# Patient Record
Sex: Male | Born: 1985 | Hispanic: No | Marital: Single | Smoking: Never smoker
Health system: Southern US, Community
[De-identification: ages and names within clinical notes are randomized; demographics above are authoritative.]

---
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
## 2014-01-25 ENCOUNTER — Encounter (HOSPITAL_COMMUNITY): Payer: Self-pay | Admitting: Emergency Medicine

## 2014-01-25 ENCOUNTER — Emergency Department (INDEPENDENT_AMBULATORY_CARE_PROVIDER_SITE_OTHER): Payer: Self-pay

## 2014-01-25 ENCOUNTER — Emergency Department (INDEPENDENT_AMBULATORY_CARE_PROVIDER_SITE_OTHER)
Admission: EM | Admit: 2014-01-25 | Discharge: 2014-01-25 | Disposition: A | Discharge status: Nursing Home (Includes ICF) | Payer: Self-pay | Source: Home / Self Care

## 2014-01-25 ENCOUNTER — Emergency Department (HOSPITAL_COMMUNITY)
Admission: EM | Admit: 2014-01-25 | Discharge: 2014-01-25 | Discharge status: Against Medical Advice | Payer: Self-pay | Attending: Emergency Medicine | Admitting: Emergency Medicine

## 2014-01-25 DIAGNOSIS — R0789 Other chest pain: Secondary | ICD-10-CM

## 2014-01-25 DIAGNOSIS — R319 Hematuria, unspecified: Secondary | ICD-10-CM | POA: Insufficient documentation

## 2014-01-25 DIAGNOSIS — S3981XA Other specified injuries of abdomen, initial encounter: Secondary | ICD-10-CM | POA: Insufficient documentation

## 2014-01-25 DIAGNOSIS — W1809XA Striking against other object with subsequent fall, initial encounter: Secondary | ICD-10-CM

## 2014-01-25 DIAGNOSIS — Y929 Unspecified place or not applicable: Secondary | ICD-10-CM | POA: Insufficient documentation

## 2014-01-25 DIAGNOSIS — W19XXXA Unspecified fall, initial encounter: Secondary | ICD-10-CM

## 2014-01-25 DIAGNOSIS — IMO0002 Reserved for concepts with insufficient information to code with codable children: Secondary | ICD-10-CM

## 2014-01-25 DIAGNOSIS — R3129 Other microscopic hematuria: Secondary | ICD-10-CM

## 2014-01-25 DIAGNOSIS — W1789XA Other fall from one level to another, initial encounter: Secondary | ICD-10-CM | POA: Insufficient documentation

## 2014-01-25 DIAGNOSIS — R1084 Generalized abdominal pain: Secondary | ICD-10-CM

## 2014-01-25 DIAGNOSIS — R071 Chest pain on breathing: Secondary | ICD-10-CM

## 2014-01-25 DIAGNOSIS — Y9389 Activity, other specified: Secondary | ICD-10-CM | POA: Insufficient documentation

## 2014-01-25 DIAGNOSIS — T07XXXA Unspecified multiple injuries, initial encounter: Secondary | ICD-10-CM

## 2014-01-25 LAB — POCT URINALYSIS DIP (DEVICE)
Bilirubin Urine: NEGATIVE
Glucose, UA: NEGATIVE mg/dL
Ketones, ur: NEGATIVE mg/dL
Leukocytes, UA: NEGATIVE
Nitrite: NEGATIVE
Protein, ur: NEGATIVE mg/dL
Specific Gravity, Urine: >=1.03 (ref 1.005–1.030)
Urobilinogen, UA: 0.2 mg/dL (ref 0.0–1.0)
pH: 6 (ref 5.0–8.0)

## 2014-01-25 MED ORDER — TETANUS-DIPHTH-ACELL PERTUSSIS 5-2.5-18.5 LF-MCG/0.5 IM SUSP
0.5000 mL | Freq: Once | INTRAMUSCULAR | Status: AC
  Administered 2014-01-25: 0.5 mL via INTRAMUSCULAR

## 2014-01-25 MED ORDER — TETANUS-DIPHTH-ACELL PERTUSSIS 5-2.5-18.5 LF-MCG/0.5 IM SUSP
INTRAMUSCULAR | Status: AC
  Filled 2014-01-25: qty 0.5

## 2014-01-25 NOTE — ED Provider Notes (Signed)
Medical screening examination/treatment/procedure(s) were performed by a resident physician or non-physician practitioner and as the supervising physician I was immediately available for consultation/collaboration.  Shelly Flattenavid Marlen Koman, MD Family Medicine   Ozella Rocksavid J Connell Bognar, MD 01/25/14 [phone]

## 2014-01-25 NOTE — ED Notes (Signed)
Injury occurred 2 hrs ago.

## 2014-01-25 NOTE — ED Notes (Signed)
Pt sent here from [facility] due to having a fall approx 7-8 ft today and landing on abd, denies loc. Having abd pain and left flank pain, sent here for ct scan due blood in urine.

## 2014-01-25 NOTE — ED Notes (Signed)
Large area of linear abrasions across abdomen.  Denies abd pain (only abrasion pain on stomach; denies internally).  C/O left lateral and posterior rib pain.  Denies SOB - states he was initially, but is no longer.  Left lateral & poterior ribs tender to touch.  Denies spinal pain.  Via interpreter: unclear description, but describes going over a fence that was butted up against a concrete block wall, when he slid forward onto concrete?

## 2014-01-25 NOTE — ED Notes (Signed)
Upon further investigation, pt states his stomach slid across the edge of the concrete wall, and he flipped over, landing on back on ground.  Continues to deny any spinal pain or head injury.

## 2014-01-25 NOTE — ED Provider Notes (Signed)
CSN: [id number]     Arrival date & time 01/25/14  1256 History   First MD Initiated Contact with Patient 01/25/14 1335     Chief Complaint  Patient presents with  . Fall   (Consider location/radiation/quality/duration/timing/severity/associated sxs/prior Treatment) HPI Comments: Nursing notes read. Fell over fence as described. C/O pain over abdomen, L lateral and posterior chest wall and ribs.   Patient is a 28 y.o. male presenting with fall.  Fall Associated symptoms include chest pain and abdominal pain. Pertinent negatives include no headaches and no shortness of breath.    History reviewed. No pertinent past medical history. History reviewed. No pertinent past surgical history. No family history on file. History  Substance Use Topics  . Smoking status: Never Smoker   . Smokeless tobacco: Not on file  . Alcohol Use: Yes     Comment: drinks weekly    Review of Systems  Constitutional: Positive for activity change. Negative for fever and fatigue.  HENT: Negative.   Eyes: Negative.   Respiratory: Negative.  Negative for cough and shortness of breath.   Cardiovascular: Positive for chest pain. Negative for palpitations and leg swelling.  Gastrointestinal: Positive for abdominal pain. Negative for nausea and vomiting.  Genitourinary: Negative.   Musculoskeletal:       As per HPI No pain or tenderness to head,, neck, spine, R back or chest, upper or lower extremities.  Skin: Negative.        Abdominal abrasions  Neurological: Negative for dizziness, weakness, numbness and headaches.    Allergies  Review of patient's allergies indicates no known allergies.  Home Medications   Prior to Admission medications   Not on File   BP 137/68  Pulse 55  Temp(Src) 98.5 F (36.9 C) (Oral)  Resp 18  SpO2 99% Physical Exam  Nursing note and vitals reviewed. Constitutional: He is oriented to person, place, and time. He appears well-developed and well-nourished.  HENT:  Head:  Normocephalic and atraumatic.  Eyes: Conjunctivae and EOM are normal. Pupils are equal, round, and reactive to light. Left eye exhibits no discharge.  Neck: Normal range of motion. Neck supple.  Nontender, no deformity  Cardiovascular: Normal rate, regular rhythm and normal heart sounds.   Pulmonary/Chest: Effort normal and breath sounds normal. No respiratory distress. He has no wheezes. He has no rales.  Tenderness to the L lateral chest wall, anterior, lateral and lesser to the upper posterior chest.  Abdominal: He exhibits no distension and no mass. There is tenderness. There is guarding.  Tenderness across the lower abdomen and epigastrium with guarding. Tenderness mostly to L mid abd.  Musculoskeletal: Normal range of motion. He exhibits no edema and no tenderness.  Neurological: He is alert and oriented to person, place, and time. No cranial nerve deficit.  Skin: Skin is warm and dry.  Multiple abrasions across the abdomen.  Psychiatric: He has a normal mood and affect.    ED Course  Procedures (including critical care time) Labs Review Labs Reviewed  POCT URINALYSIS DIP (DEVICE) - Abnormal; Notable for the following:    Hgb urine dipstick MODERATE (*)    All other components within normal limits    Imaging Review Dg Ribs Unilateral W/chest Left  01/25/2014   CLINICAL DATA:  28 year old male with chest and left rib pain following injury  EXAM: LEFT RIBS AND CHEST - 3+ VIEW  COMPARISON:  None.  FINDINGS: The cardiomediastinal silhouette is unremarkable.  There is no evidence of focal airspace disease, pulmonary edema,  suspicious pulmonary nodule/mass, pleural effusion, or pneumothorax. No acute bony abnormalities are identified.  There is no evidence of left rib fracture or abnormality.  IMPRESSION: Negative.   Electronically Signed   By: Laveda AbbeJeff  Hu M.D.   On: 01/25/2014 14:19     MDM   1. Struck fence with fall   2. Generalized abdominal pain   3. Multiple abrasions   4.  Left-sided chest wall pain   5. Hematuria, microscopic    Consult with Dr. Artis FlockKindl. L rib detail and chest films with no seen pathology. [facility] for evaluation of abdominal tenderness, trauma and hematuria S/P fall.    Hayden Rasmussenavid Erricka Falkner, NP 01/25/14 1434  Hayden Rasmussenavid Decarlo Rivet, NP 01/25/14 1438

## 2014-01-29 ENCOUNTER — Encounter (HOSPITAL_COMMUNITY): Payer: Self-pay | Admitting: Emergency Medicine

## 2014-01-29 ENCOUNTER — Emergency Department (HOSPITAL_COMMUNITY): Payer: Self-pay

## 2014-01-29 ENCOUNTER — Emergency Department (HOSPITAL_COMMUNITY)
Admission: EM | Admit: 2014-01-29 | Discharge: 2014-01-29 | Disposition: A | Discharge status: Home / Self Care | Payer: Self-pay | Attending: Emergency Medicine | Admitting: Emergency Medicine

## 2014-01-29 DIAGNOSIS — R0602 Shortness of breath: Secondary | ICD-10-CM | POA: Insufficient documentation

## 2014-01-29 DIAGNOSIS — T07XXXA Unspecified multiple injuries, initial encounter: Secondary | ICD-10-CM

## 2014-01-29 DIAGNOSIS — Y929 Unspecified place or not applicable: Secondary | ICD-10-CM | POA: Insufficient documentation

## 2014-01-29 DIAGNOSIS — Y9389 Activity, other specified: Secondary | ICD-10-CM | POA: Insufficient documentation

## 2014-01-29 DIAGNOSIS — R079 Chest pain, unspecified: Secondary | ICD-10-CM | POA: Insufficient documentation

## 2014-01-29 DIAGNOSIS — W1809XA Striking against other object with subsequent fall, initial encounter: Secondary | ICD-10-CM | POA: Insufficient documentation

## 2014-01-29 DIAGNOSIS — IMO0002 Reserved for concepts with insufficient information to code with codable children: Secondary | ICD-10-CM | POA: Insufficient documentation

## 2014-01-29 DIAGNOSIS — S20212S Contusion of left front wall of thorax, sequela: Secondary | ICD-10-CM

## 2014-01-29 DIAGNOSIS — S20219A Contusion of unspecified front wall of thorax, initial encounter: Secondary | ICD-10-CM | POA: Insufficient documentation

## 2014-01-29 LAB — CBC WITH DIFFERENTIAL/PLATELET
BASOS ABS: 0 10*3/uL (ref 0.0–0.1)
BASOS PCT: 0 % (ref 0–1)
Eosinophils Absolute: 0.2 10*3/uL (ref 0.0–0.7)
Eosinophils Relative: 4 % (ref 0–5)
HCT: 44.9 % (ref 39.0–52.0)
Hemoglobin: 15.3 g/dL (ref 13.0–17.0)
LYMPHS PCT: 31 % (ref 12–46)
Lymphs Abs: 1.8 10*3/uL (ref 0.7–4.0)
MCH: 30.9 pg (ref 26.0–34.0)
MCHC: 34.1 g/dL (ref 30.0–36.0)
MCV: 90.7 fL (ref 78.0–100.0)
MONO ABS: 0.4 10*3/uL (ref 0.1–1.0)
Monocytes Relative: 6 % (ref 3–12)
Neutro Abs: 3.3 10*3/uL (ref 1.7–7.7)
Neutrophils Relative %: 59 % (ref 43–77)
PLATELETS: 165 10*3/uL (ref 150–400)
RBC: 4.95 MIL/uL (ref 4.22–5.81)
RDW: 12.3 % (ref 11.5–15.5)
WBC: 5.7 10*3/uL (ref 4.0–10.5)

## 2014-01-29 LAB — URINALYSIS, ROUTINE W REFLEX MICROSCOPIC
Bilirubin Urine: NEGATIVE
Glucose, UA: NEGATIVE mg/dL
KETONES UR: NEGATIVE mg/dL
LEUKOCYTES UA: NEGATIVE
Nitrite: NEGATIVE
PROTEIN: NEGATIVE mg/dL
Specific Gravity, Urine: 1.035 — ABNORMAL HIGH (ref 1.005–1.030)
UROBILINOGEN UA: 0.2 mg/dL (ref 0.0–1.0)
pH: 5.5 (ref 5.0–8.0)

## 2014-01-29 LAB — URINE MICROSCOPIC-ADD ON

## 2014-01-29 MED ORDER — OXYCODONE-ACETAMINOPHEN 5-325 MG PO TABS
1.0000 | ORAL_TABLET | Freq: Once | ORAL | Status: AC
  Administered 2014-01-29: 1 via ORAL
  Filled 2014-01-29: qty 1

## 2014-01-29 MED ORDER — IBUPROFEN 600 MG PO TABS: 600.0000 mg | ORAL_TABLET | Freq: Four times a day (QID) | ORAL | Status: DC | PRN

## 2014-01-29 MED ORDER — OXYCODONE-ACETAMINOPHEN 5-325 MG PO TABS
1.0000 | ORAL_TABLET | Freq: Four times a day (QID) | ORAL | Status: AC | PRN
Start: 2014-01-29 — End: ?

## 2014-01-29 NOTE — ED Provider Notes (Signed)
CSN: [id number]     Arrival date & time 01/29/14  0509 History   First MD Initiated Contact with Patient 01/29/14 [phone]     Chief Complaint  Patient presents with  . Rib Pain      (Consider location/radiation/quality/duration/timing/severity/associated sxs/prior Treatment) HPI  This is a 28 year old male who presents with left hip pain. Patient is primarily Spanish-speaking and history was obtained by translator. Patient states that he fell off of a single wall last Saturday and struck his abdomen and left chest. Since that time he has had progressive left chest wall pain. He's been unable to sleep at night. Currently his pain is 7/10. He denies any abdominal pain, nausea, vomiting. He denies any dizziness or weakness. He was seen at urgent care and reports that he had "blood in his urine." Patient has been taking Motrin with little relief.  I have reviewed patient's chart. Patient was seen 7/25 at urgent care and referred to [facility] ED at that time for further evaluation.  Did not get seen.    History reviewed. No pertinent past medical history. History reviewed. No pertinent past surgical history. History reviewed. No pertinent family history. History  Substance Use Topics  . Smoking status: Never Smoker   . Smokeless tobacco: Not on file  . Alcohol Use: Yes     Comment: drinks weekly    Review of Systems  Constitutional: Negative.   Respiratory: Positive for chest tightness and shortness of breath. Negative for cough.   Cardiovascular: Positive for chest pain.  Gastrointestinal: Negative.  Negative for nausea, vomiting and abdominal pain.  Genitourinary: Negative.  Negative for dysuria.  Musculoskeletal: Negative for back pain.  Skin: Positive for wound. Negative for rash.  All other systems reviewed and are negative.     Allergies  Review of patient's allergies indicates no known allergies.  Home Medications   Prior to Admission medications   Medication Sig Start  Date End Date Taking? Authorizing Provider  ibuprofen (ADVIL,MOTRIN) 600 MG tablet Take 1 tablet (600 mg total) by mouth every 6 (six) hours as needed. 01/29/14   Shon Baton, MD  oxyCODONE-acetaminophen (PERCOCET/ROXICET) 5-325 MG per tablet Take 1-2 tablets by mouth every 6 (six) hours as needed for severe pain. 01/29/14   Shon Baton, MD   BP 123/63  Pulse 50  Temp(Src) 97.8 F (36.6 C) (Oral)  Resp 20  Ht 5\' 8"  (1.727 m)  Wt 211 lb (95.709 kg)  BMI 32.09 kg/m2  SpO2 99% Physical Exam  Nursing note and vitals reviewed. Constitutional: He is oriented to person, place, and time. He appears well-developed and well-nourished. No distress.  HENT:  Head: Normocephalic and atraumatic.  Mouth/Throat: Oropharynx is clear and moist.  Eyes: Pupils are equal, round, and reactive to light.  Cardiovascular: Normal rate, regular rhythm and normal heart sounds.   No murmur heard. Pulmonary/Chest: Effort normal and breath sounds normal. No respiratory distress. He has no wheezes. He exhibits tenderness.  Tenderness to palpation over the left chest wall without crepitus or overlying skin changes  Abdominal: Soft. Bowel sounds are normal. There is no tenderness. There is no rebound and no guarding.  Musculoskeletal: He exhibits no edema.  No deformity noted  Lymphadenopathy:    He has no cervical adenopathy.  Neurological: He is alert and oriented to person, place, and time.  Skin: Skin is warm and dry.  Superficial abrasions noted over the entire anterior abdomen  Psychiatric: He has a normal mood and affect.  ED Course  Procedures (including critical care time) Labs Review Labs Reviewed  URINALYSIS, ROUTINE W REFLEX MICROSCOPIC - Abnormal; Notable for the following:    Specific Gravity, Urine 1.035 (*)    Hgb urine dipstick TRACE (*)    All other components within normal limits  CBC WITH DIFFERENTIAL  URINE MICROSCOPIC-ADD ON    Imaging Review Dg Ribs Unilateral W/chest  Left  01/29/2014   CLINICAL DATA:  Left lower anterior rib pain after a fall.  EXAM: LEFT RIBS AND CHEST - 3+ VIEW  COMPARISON:  01/25/2014  FINDINGS: Shallow inspiration. Heart size and pulmonary vascularity are normal. No focal airspace disease or consolidation in the lungs. No pneumothorax.  Left ribs appear intact. No displaced rib fractures or focal bone lesions appreciated. No significant changes since previous study.  IMPRESSION: Negative.   Electronically Signed   By: Burman NievesWilliam  Stevens M.D.   On: 01/29/2014 06:17     EKG Interpretation None      MDM   Final diagnoses:  Rib contusion, left, sequela  Multiple abrasions    Patient presents with persistent left-sided chest pain.  Patient sustained a fall on Saturday. He was seen in urgent care and referred to the emergency room but did not followup. He reportedly had hemoglobin in his urine. Patient is nontoxic-appearing. Vital signs are within normal limits. He has abrasions over his anterior abdomen and tenderness to palpation of the left chest wall without crepitus. Rib and chest x-ray are without evidence of fracture or pneumothorax.  Repeat UA shows trace hemoglobin with 0-2 red cells. Screening CBC was also  Obtained to evaluate for anemia. Patient's hemoglobin is 15.3. Patient was given pain medications. Given that he is 5 days out from injury and has normal vital signs were stable hemoglobin, have low suspicion for internal bleeding. Abdominal exam is benign without signs of peritonitis. Patient will be given pain medications. He was given strict return precautions.  After history, exam, and medical workup I feel the patient has been appropriately medically screened and is safe for discharge home. Pertinent diagnoses were discussed with the patient. Patient was given return precautions.     Shon Batonourtney F Janaisha Tolsma, MD 01/29/14 [phone]

## 2014-01-29 NOTE — ED Notes (Signed)
Translator phone in use for assessment.

## 2014-01-29 NOTE — ED Notes (Signed)
Pain persisting since sat. I feel down and hit my stomach and rib. Pt. Reports taking Advil.

## 2014-01-29 NOTE — ED Notes (Signed)
Patient transported to X-ray 

## 2014-01-29 NOTE — ED Notes (Signed)
Patient stated he fell on his right side hurting his ribs

## 2014-01-29 NOTE — Discharge Instructions (Signed)
Abrasin  (Abrasion) Una abrasin es un corte o una raspadura en la piel. Las abrasiones no se extienden a travs de todas las capas de la piel y la [place] se curan dentro de los [address]. Es importante cuidar de la abrasin de [place] para prevenir las infecciones.  CAUSAS  La mayora de las abrasiones son causadas por cadas o deslizamientos contra el suelo u otra superficie. Cuando la piel se frota en algo, la capa externa e interna de la piel se raspan, causando una abrasin.  DIAGNSTICO  El mdico diagnosticar una abrasin durante el examen fsico.  TRATAMIENTO  El tratamiento depende de la extensin y la profundidad de la abrasin. En general, podr limpiarla con agua y un jabn suave para eliminar la suciedad o residuos. Podr aplicarse un ungento antibitico para prevenir una infeccin. Deber colocarse un apsito (vendaje) alrededor de la abrasin para evitar que se ensucie.  Deber aplicarse la vacuna contra el ttanos si:  No recuerda cundo se coloc la vacuna la ltima vez.  Nunca recibi esta vacuna.  La lesin ha [employer]. Si le han aplicado la vacuna contra el ttanos, el brazo podr hincharse, enrojecer y sentirse caliente al tacto. Esto es frecuente y no es un problema. Si usted necesita aplicarse la vacuna y se niega a recibirla, corre riesgo de contraer ttanos. La enfermedad por ttanos puede ser grave.  INSTRUCCIONES PARA EL CUIDADO EN EL HOGAR   Si le han colocado un vendaje, cmbielo por lo menos una vez por da o segn lo que le recomiende su mdico. Si el vendaje se adhiere, remjelo con agua tibia.   Lave el rea con agua y un jabn [employer] veces al da para eliminar todo el ungento. Enjuague el jabn y seque suavemente la zona con una toalla limpia.  Vuelva a aplicar la pomada segn las indicaciones de su mdico. Esto le ayudar a prevenir las infecciones y a Automotive engineer que el vendaje se Building services engineer. Utilice una gasa sobre la herida y bajo el apsito  para evitar que el vendaje se pegue.   Cambie el vendaje inmediatamente si se moja o se ensucia.   Slo tome medicamentos de venta libre o recetados para Chief Technology Officer, el Dentist o la [place], segn las indicaciones de su mdico.   [place] un control con su mdico dentro de las 24 a 48 horas para que vea la herida, o segn las indicaciones. Si no  le dieron fecha para un control, observe cuidadosamente la abrasin para ver si hay enrojecimiento, hinchazn o pus. Estos son signos de infeccin. SOLICITE ATENCIN MDICA DE INMEDIATO SI:   Siente mucho dolor en la herida.   Tiene enrojecimiento, hinchazn o sensibilidad en la herida.   Observa que sale pus de la herida.   Tiene fiebre o sntomas que persisten durante ms de 2 o 3 das.  Tiene fiebre y los sntomas empeoran de manera sbita.  Advierte un olor ftido que proviene de la herida o del vendaje.  ASEGRESE DE QUE:   Comprende estas instrucciones.  Controlar su enfermedad.  Solicitar ayuda de inmediato si no mejora o empeora. Document Released: 06/20/2005 Document Revised: 06/06/2012 [facility] Patient Information 2015 [place], [place]. This information is not intended to replace advice given to you by your health care provider. Make sure you discuss any questions you have with your health care provider. Contusin en las costillas  (Rib Contusion)  Una contusin en las costillas (hematoma) sucede como consecuencia de un golpe en el  trax o una cada contra un objeto duro. Generalmente la mejora llega luego de un par de semanas. Si hoy le tomaron radiografas y no hallaron fracturas (ruptura de los [place]), se realiza el diagnstico de contusin. Sin embargo, English as a second language teachermuchas veces la fractura de las costillas no se observa hasta que pasan [address] das, o puede descubrirse mucho ms tarde, con una radiografa de rutina en la que se vern signos de curacin. Si esto le ocurre, no significa que hubo un error al observar las radiografas, sino  simplemente que no se manifest en las primeras placas. En general, el diagnstico precoz no modifica el tratamiento. INSTRUCCIONES PARA EL CUIDADO DOMICILIARIO  Evite las actividades extenuantes. Realice las tareas con cuidado y evite golpearse las costillas lesionadas. Trate en lo posible de no realizar actividades que presionen las costillas lesionadas y Freight forwarderle produzcan dolor.  Durante uno o Medicine Lodgedos das, [place]puede ser beneficioso colocar una bolsa con hielo cada veinte minutos mientras est despierto. Coloque el hielo en una bolsa plstica y ponga una toalla entre la bolsa y la piel.  Consuma una dieta normal y bien balanceada. Beba gran cantidad de lquidos para evitar la constipacin.  Respire profundo varias veces al da para State Street Corporationmantener los pulmones libres de infecciones. Trate de toser Engineer, maintenancevarias veces al da. Sostenga el rea lesionada con una almohada mientras tose para Engineer, materialsaliviar el dolor. Expectorar puede ayudarlo a prevenir la neumona.  Utilice un sostn de costillas o faja SLO SI se lo ha indicado el mdico. Si los [place]utiliza, Photographerdeber realizar los ejercicios de respiracin segn se le haya indicado. Si no se Foot Lockerutilizan adecuadamente, los cinturones o fajas restringen la respiracin y pueden causar una neumona.  Solo tome medicamentos que se pueden comprar sin receta o recetados para Chief Technology Officerel dolor, Dentistmalestar o fiebre, como le indica el mdico. SOLICITE ATENCIN MDICA SI:  Daphane ShepherdUsted o su nio tienen una temperatura oral de ms de 38,9 C (102 F).  El beb tiene ms de 3 meses y su temperatura rectal es de 100.5 F (38.1 C) o ms durante ms de 1 da.  Aparece tos continua, asociada a esputo espeso o sanguinolento. SOLICITE ATENCIN MDICA DE INMEDIATO SI:  Presenta dificultades respiratorias.  Siente nuseas (ganas de vomitar), tiene vmitos o dolor abdominal (en el vientre).  El dolor empeora y no puede controlarlo con los medicamentos o hay un cambio en la ubicacin del dolor.  Comienza a sentir  sudoraciones o Chief Technology Officerel dolor se Health Netirradia hacia los brazos, la [place]ula o los hombros, o siente mareos o se [place]maya.  Usted o su nio tienen una temperatura oral de ms de 38,9 C (102 F) y no puede controlarla con medicamentos.  Su beb tiene ms de 3 meses y su temperatura rectal es de 102 F (38.9 C) o ms.  Su beb tiene 3 meses o menos y su temperatura rectal es de 100.4 F (38 C) o ms. EST SEGURO QUE:   Comprende las instrucciones para el alta mdica.  Controlar su enfermedad.  Solicitar atencin mdica de inmediato segn las indicaciones. Document Released: 03/30/2005 Document Revised: 09/12/2011 [facility] Patient Information 2015 [place], [place]. This information is not intended to replace advice given to you by your health care provider. Make sure you discuss any questions you have with your health care provider.

## 2014-01-29 NOTE — ED Notes (Signed)
MD at bedside. 

## 2014-01-29 NOTE — ED Notes (Signed)
Pt aware of need of urine specimen; pt handed urinal to use when he can; family at bedside; phlebotomy at bedside

## 2015-07-15 IMAGING — CR DG RIBS W/ CHEST 3+V*L*
4 series · 4 of 4 positions shown · non-contrast
Comparison: 01/25/2014

CLINICAL DATA: Left lower anterior rib pain after a fall.

EXAM:
LEFT RIBS AND CHEST - 3+ VIEW

[w chest pa]
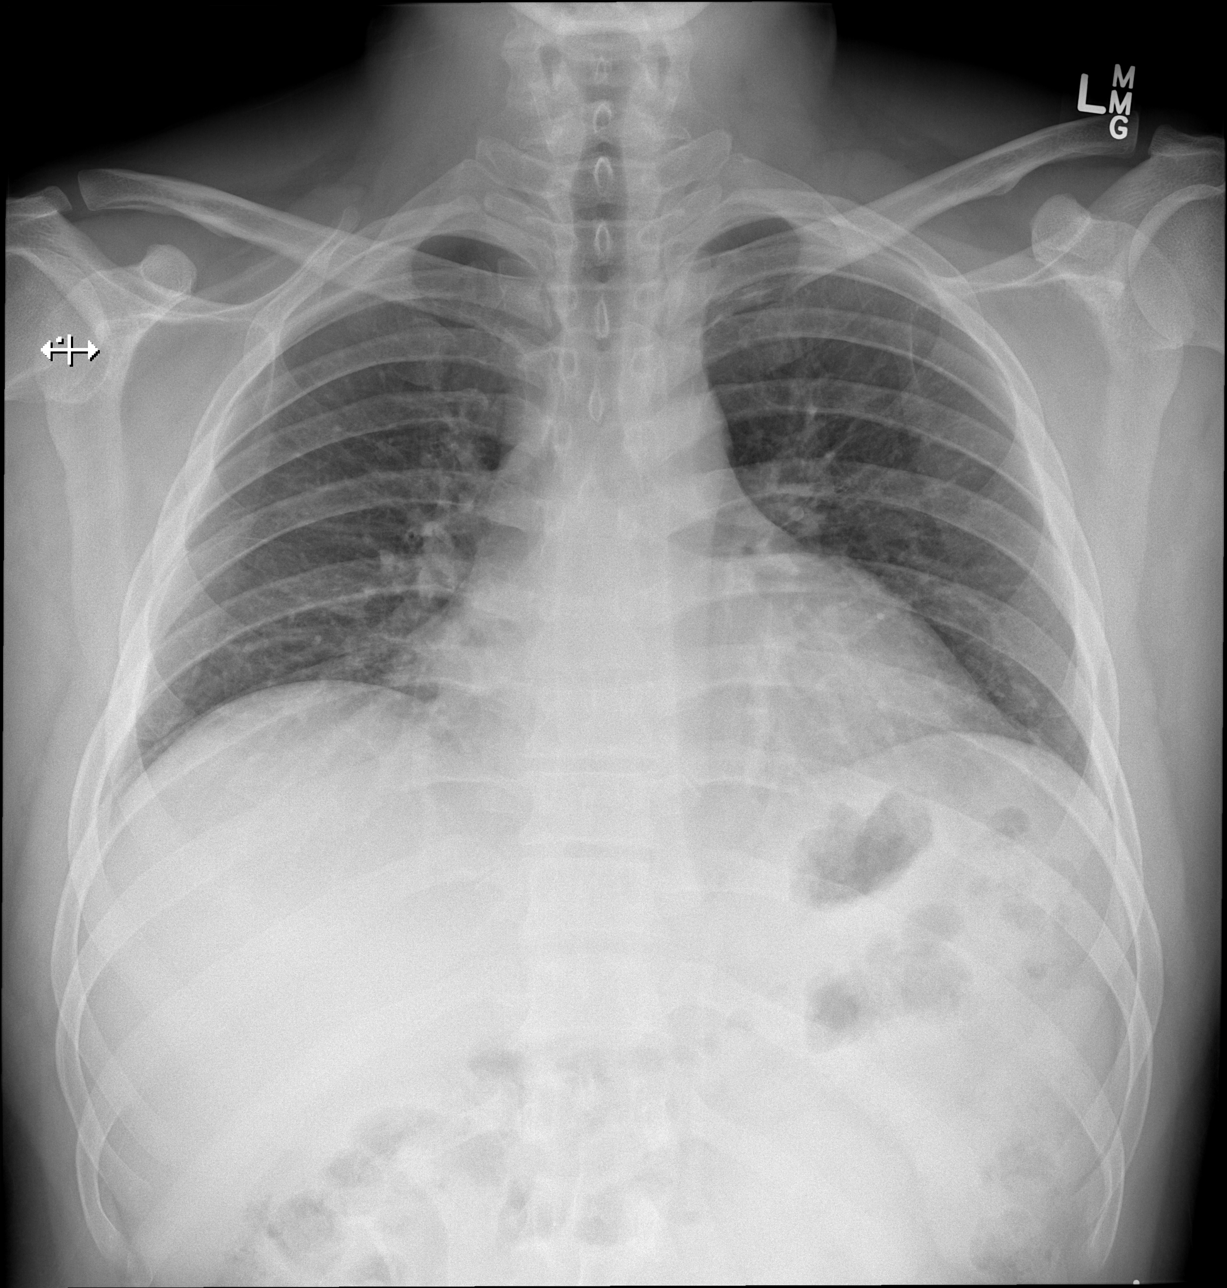

[w ribs ap upper left]
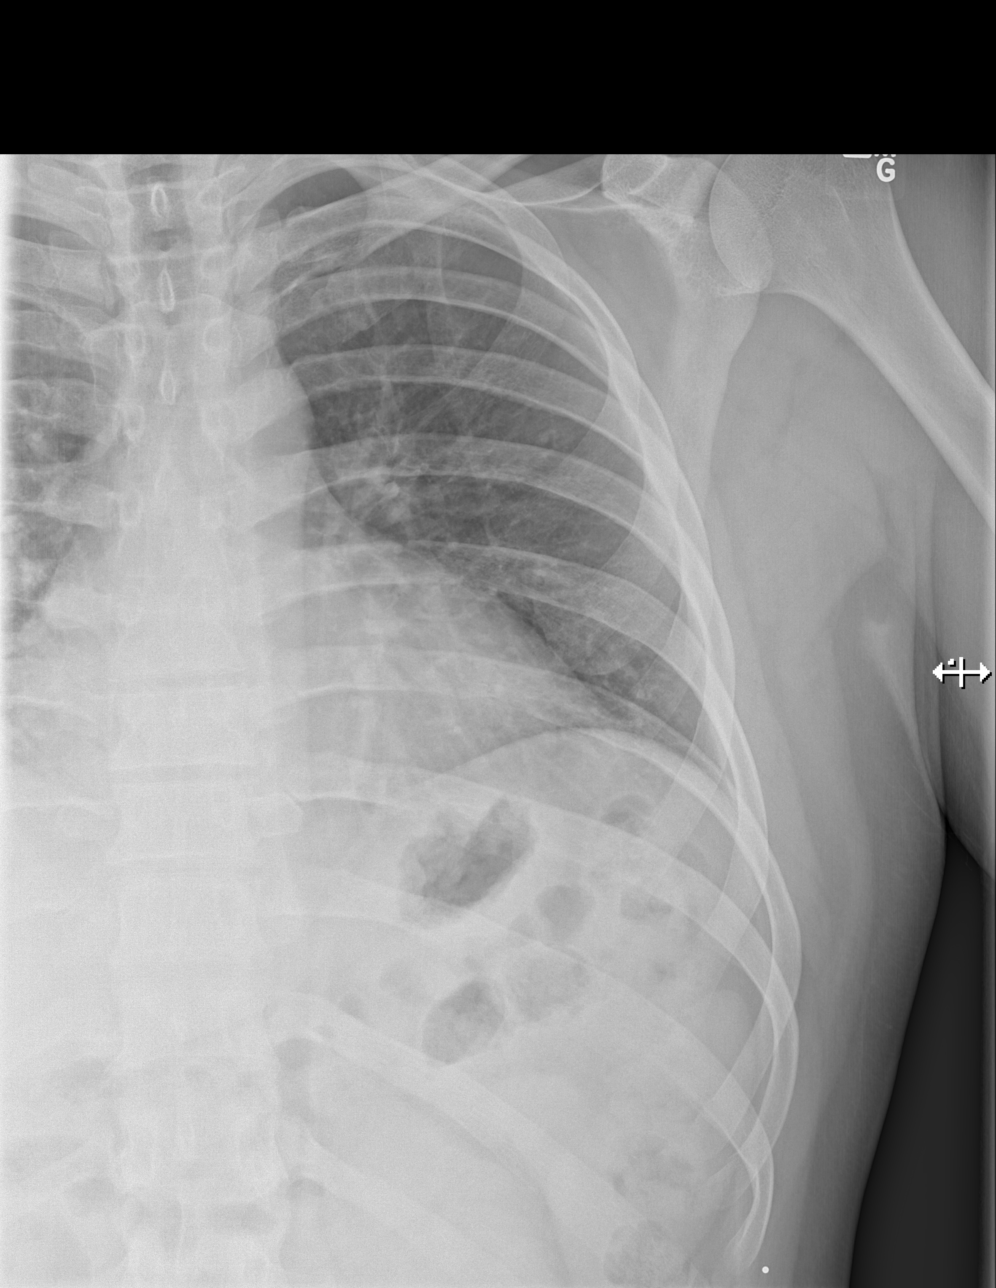

[w ribs ap lower left]
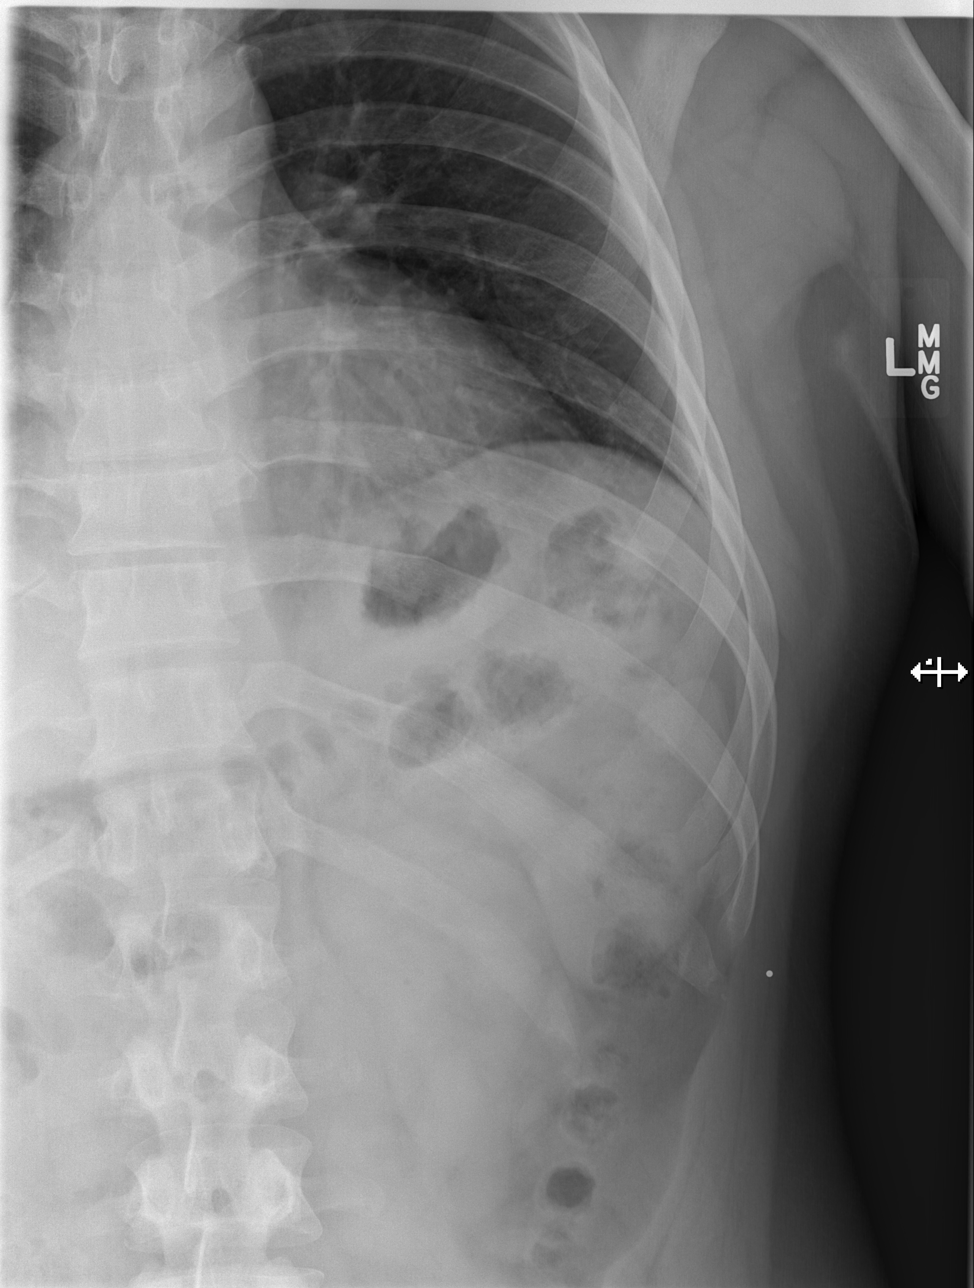

[w ribs obl left]
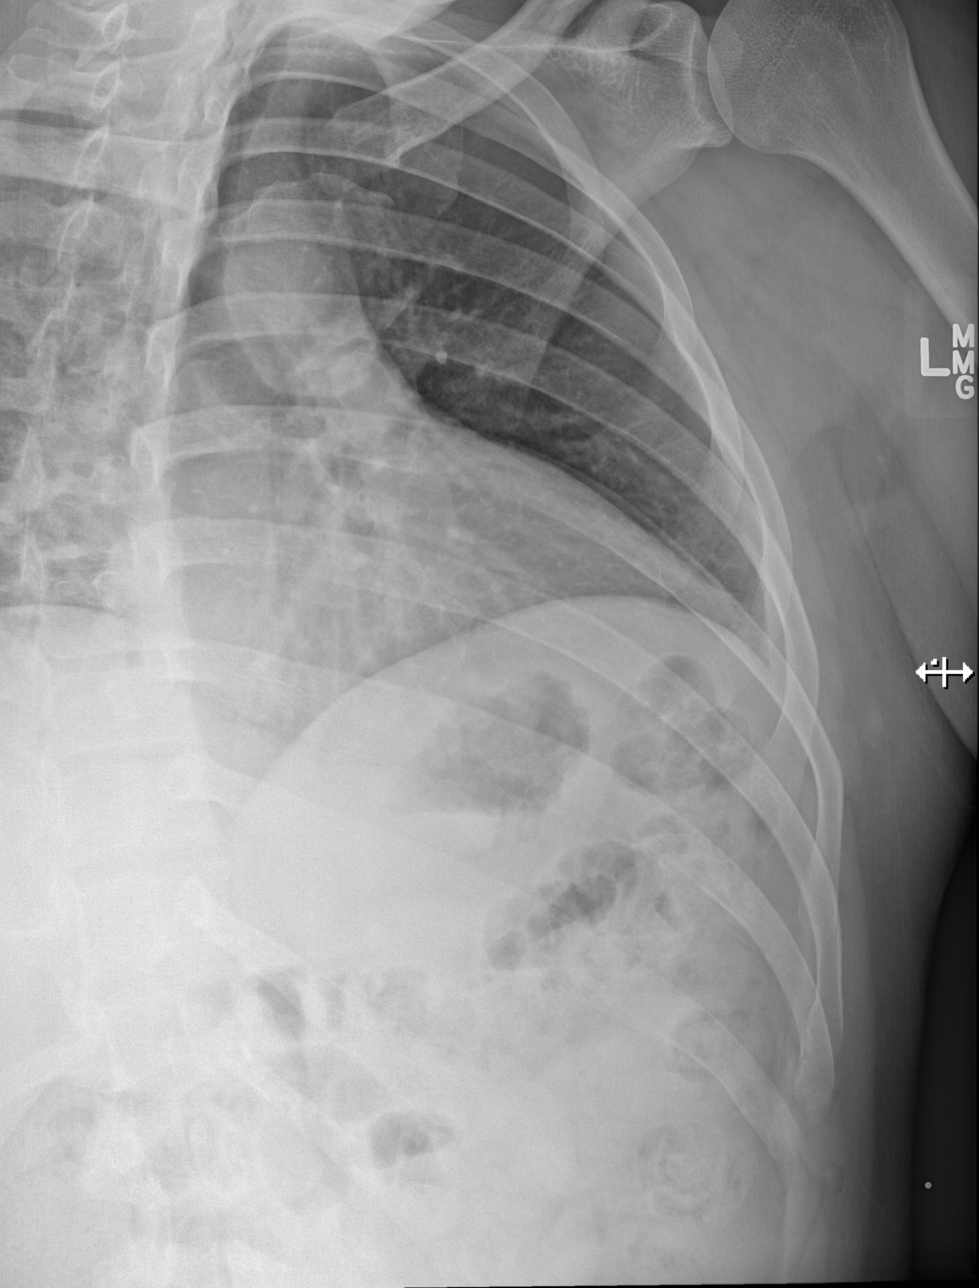

[4 of 4 positions shown; findings below may reference images not displayed]

FINDINGS: Shallow inspiration. Heart size and pulmonary vascularity are
normal. No focal airspace disease or consolidation in the lungs. No
pneumothorax.

Left ribs appear intact. No displaced rib fractures or focal bone
lesions appreciated. No significant changes since previous study.
IMPRESSION: Negative.

## 2021-10-11 ENCOUNTER — Encounter (HOSPITAL_COMMUNITY): Payer: Self-pay | Admitting: Emergency Medicine

## 2021-10-11 ENCOUNTER — Ambulatory Visit (HOSPITAL_COMMUNITY)
Admission: EM | Admit: 2021-10-11 | Discharge: 2021-10-11 | Disposition: A | Discharge status: Home / Self Care | Payer: Self-pay | Attending: Physician Assistant | Admitting: Physician Assistant

## 2021-10-11 DIAGNOSIS — R519 Headache, unspecified: Secondary | ICD-10-CM

## 2021-10-11 LAB — CBG MONITORING, ED: Glucose-Capillary: 108 mg/dL — ABNORMAL HIGH (ref 70–99)

## 2021-10-11 MED ORDER — KETOROLAC TROMETHAMINE 30 MG/ML IJ SOLN
30.0000 mg | Freq: Once | INTRAMUSCULAR | Status: AC
  Administered 2021-10-11: 30 mg via INTRAMUSCULAR

## 2021-10-11 MED ORDER — KETOROLAC TROMETHAMINE 30 MG/ML IJ SOLN
INTRAMUSCULAR | Status: AC
  Filled 2021-10-11: qty 1

## 2021-10-11 NOTE — ED Provider Notes (Signed)
?[facility] ? ? ? ?CSN: [id number] ?Arrival date & time: 10/11/21  0803 ? ? ?  ? ?History   ?Chief Complaint ?Chief Complaint  ?Patient presents with  ? Headache  ? ? ?HPI ?Philip Hammond is a 36 y.o. male.  ? ?Pt complains of a dull aching left sided headache that started 4-5 days ago.  He rates his current pain 4-5/10.  He reports headache in the past similar to this, but does not suffer from migraines or regular headaches.  He reports mild photophobia, denies visual disturbances.  Denies n/v, weakness, trouble with balance, speech disturbances, dizziness.  He has taken [facility] powder with temporary relief.  He does report a history is allergies and has had some itching eyes and scratchy throat lately.  Denies eye tearing or nasal discharge.   ? ? ?History reviewed. No pertinent past medical history. ? ?There are no problems to display for this patient. ? ? ?History reviewed. No pertinent surgical history. ? ? ? ? ?Home Medications   ? ?Prior to Admission medications   ?Medication Sig Start Date End Date Taking? Authorizing Provider  ?ibuprofen (ADVIL,MOTRIN) 600 MG tablet Take 1 tablet (600 mg total) by mouth every 6 (six) hours as needed. 01/29/14   Horton, Mayer Masker, MD  ?oxyCODONE-acetaminophen (PERCOCET/ROXICET) 5-325 MG per tablet Take 1-2 tablets by mouth every 6 (six) hours as needed for severe pain. 01/29/14   Horton, Mayer Masker, MD  ? ? ?Family History ?History reviewed. No pertinent family history. ? ?Social History ?Social History  ? ?Tobacco Use  ? Smoking status: Never  ?Substance Use Topics  ? Alcohol use: Yes  ?  Comment: drinks weekly  ? Drug use: No  ? ? ? ?Allergies   ?Patient has no known allergies. ? ? ?Review of Systems ?Review of Systems  ?Constitutional:  Negative for chills and fever.  ?HENT:  Negative for ear pain and sore throat.   ?Eyes:  Positive for photophobia. Negative for pain, discharge and visual disturbance.  ?Respiratory:  Negative for cough and shortness of breath.    ?Cardiovascular:  Negative for chest pain and palpitations.  ?Gastrointestinal:  Negative for abdominal pain and vomiting.  ?Genitourinary:  Negative for dysuria and hematuria.  ?Musculoskeletal:  Negative for arthralgias and back pain.  ?Skin:  Negative for color change and rash.  ?Neurological:  Positive for headaches. Negative for dizziness, seizures and syncope.  ?All other systems reviewed and are negative. ? ? ?Physical Exam ?Triage Vital Signs ?ED Triage Vitals  ?Enc Vitals Group  ?   BP 10/11/21 0822 (!) 145/89  ?   Pulse Rate 10/11/21 0822 (!) 54  ?   Resp 10/11/21 0822 17  ?   Temp 10/11/21 0822 97.9 ?F (36.6 ?C)  ?   Temp Source 10/11/21 0822 Oral  ?   SpO2 10/11/21 0822 98 %  ?   Weight --   ?   Height --   ?   Head Circumference --   ?   Peak Flow --   ?   Pain Score 10/11/21 0823 5  ?   Pain Loc --   ?   Pain Edu? --   ?   Excl. in GC? --   ? ?No data found. ? ?Updated Vital Signs ?BP (!) 145/89   Pulse (!) 54   Temp 97.9 ?F (36.6 ?C) (Oral)   Resp 17   SpO2 98%  ? ?Visual Acuity ?Right Eye Distance:   ?Left Eye Distance:   ?  Bilateral Distance:   ? ?Right Eye Near:   ?Left Eye Near:    ?Bilateral Near:    ? ?Physical Exam ?Vitals and nursing note reviewed.  ?Constitutional:   ?   General: He is not in acute distress. ?   Appearance: He is well-developed.  ?HENT:  ?   Head: Normocephalic and atraumatic.  ?Eyes:  ?   Conjunctiva/sclera: Conjunctivae normal.  ?Cardiovascular:  ?   Rate and Rhythm: Normal rate and regular rhythm.  ?   Heart sounds: No murmur heard. ?Pulmonary:  ?   Effort: Pulmonary effort is normal. No respiratory distress.  ?   Breath sounds: Normal breath sounds.  ?Abdominal:  ?   Palpations: Abdomen is soft.  ?   Tenderness: There is no abdominal tenderness.  ?Musculoskeletal:     ?   General: No swelling.  ?   Cervical back: Neck supple.  ?Skin: ?   General: Skin is warm and dry.  ?   Capillary Refill: Capillary refill takes less than 2 seconds.  ?Neurological:  ?   Mental  Status: He is alert.  ?Psychiatric:     ?   Mood and Affect: Mood normal.  ? ? ? ?UC Treatments / Results  ?Labs ?(all labs ordered are listed, but only abnormal results are displayed) ?Labs Reviewed  ?CBG MONITORING, ED - Abnormal; Notable for the following components:  ?    Result Value  ? Glucose-Capillary 108 (*)   ? All other components within normal limits  ? ? ?EKG ? ? ?Radiology ?No results found. ? ?Procedures ?Procedures (including critical care time) ? ?Medications Ordered in UC ?Medications  ?ketorolac (TORADOL) 30 MG/ML injection 30 mg (30 mg Intramuscular Given 10/11/21 0854)  ? ? ?Initial Impression / Assessment and Plan / UC Course  ?I have reviewed the triage vital signs and the nursing notes. ? ?Pertinent labs & imaging results that were available during my care of the patient were reviewed by me and considered in my medical decision making (see chart for details). ? ?  ? ?Headache, toradol given in clinic today with mild improvement.  Normal neurological exam.  Advised Tylenol or Excedrin at home for headache.  Recommend daily allergy medication due to some mild allergy sx. ED precautions given. Pt requested CBG in clinic today.  He also requested a cholesterol check, I advised him to follow up with PCP for evaluation of cholesterol.  ?Final Clinical Impressions(s) / UC Diagnoses  ? ?Final diagnoses:  ?Acute nonintractable headache, unspecified headache type  ? ? ? ?Discharge Instructions   ? ?  ?Recommend rest, Tylenol, drink plenty of fluids ?Recommend restarting allergy medication, Claritin.  ?Return for evaluation if no improvement with this plan or headache becomes worse.  ? ? ? ? ?ED Prescriptions   ?None ?  ? ?PDMP not reviewed this encounter. ?  ?Ward, Tylene Fantasia, PA-C ?10/11/21 1572 ? ?

## 2021-10-11 NOTE — ED Triage Notes (Signed)
Pt is present today with HA located behind the back of his left eye. Pt sx started 4-5 days ago.  ?

## 2021-10-11 NOTE — Discharge Instructions (Addendum)
Recommend rest, Tylenol, drink plenty of fluids ?Recommend restarting allergy medication, Claritin.  ?Return for evaluation if no improvement with this plan or headache becomes worse.  ?

## 2023-02-26 ENCOUNTER — Ambulatory Visit (HOSPITAL_COMMUNITY)
Admission: EM | Admit: 2023-02-26 | Discharge: 2023-02-26 | Disposition: A | Discharge status: Home / Self Care | Payer: Self-pay | Attending: Physician Assistant | Admitting: Physician Assistant

## 2023-02-26 ENCOUNTER — Encounter (HOSPITAL_COMMUNITY): Payer: Self-pay | Admitting: *Deleted

## 2023-02-26 DIAGNOSIS — R1031 Right lower quadrant pain: Secondary | ICD-10-CM

## 2023-02-26 MED ORDER — CYCLOBENZAPRINE HCL 10 MG PO TABS: 10.0000 mg | ORAL_TABLET | Freq: Two times a day (BID) | ORAL | 0 refills | Status: AC | PRN

## 2023-02-26 MED ORDER — IBUPROFEN 600 MG PO TABS: 600.0000 mg | ORAL_TABLET | Freq: Four times a day (QID) | ORAL | 0 refills | Status: AC | PRN

## 2023-02-26 NOTE — ED Provider Notes (Signed)
[facility]    CSN: [id number] Arrival date & time: 02/26/23  1545      History   Chief Complaint Chief Complaint  Patient presents with   Groin Pain    HPI Philip Hammond is a 37 y.o. male.   Patient complains of right groin pain that started about 8 days ago.  He reports yesterday pain became worse after he was riding a horse and felt something pull in the groin.  He did state noticing a bulge but that is no longer there.  He has taken Tylenol with minimal relief.  He has no previous right-sided groin pain.  Denies testicular pain, abdominal pain, dysuria.    History reviewed. No pertinent past medical history.  There are no problems to display for this patient.   History reviewed. No pertinent surgical history.     Home Medications    Prior to Admission medications   Medication Sig Start Date End Date Taking? Authorizing Provider  cyclobenzaprine (FLEXERIL) 10 MG tablet Take 1 tablet (10 mg total) by mouth 2 (two) times daily as needed for muscle spasms. 02/26/23  Yes Ward, Tylene Fantasia, PA-C  ibuprofen (ADVIL) 600 MG tablet Take 1 tablet (600 mg total) by mouth every 6 (six) hours as needed. 02/26/23  Yes Ward, Tylene Fantasia, PA-C  oxyCODONE-acetaminophen (PERCOCET/ROXICET) 5-325 MG per tablet Take 1-2 tablets by mouth every 6 (six) hours as needed for severe pain. 01/29/14   Horton, Mayer Masker, MD    Family History History reviewed. No pertinent family history.  Social History Social History   Tobacco Use   Smoking status: Never  Vaping Use   Vaping status: Never Used  Substance Use Topics   Alcohol use: Yes    Comment: drinks weekly   Drug use: No     Allergies   Patient has no known allergies.   Review of Systems Review of Systems  Constitutional:  Negative for chills and fever.  HENT:  Negative for ear pain and sore throat.   Eyes:  Negative for pain and visual disturbance.  Respiratory:  Negative for cough and shortness of breath.    Cardiovascular:  Negative for chest pain and palpitations.  Gastrointestinal:  Negative for abdominal pain and vomiting.  Genitourinary:  Negative for dysuria and hematuria.       Groin pain  Musculoskeletal:  Negative for arthralgias and back pain.  Skin:  Negative for color change and rash.  Neurological:  Negative for seizures and syncope.  All other systems reviewed and are negative.    Physical Exam Triage Vital Signs ED Triage Vitals  Encounter Vitals Group     BP 02/26/23 1649 (!) 142/91     Systolic BP Percentile --      Diastolic BP Percentile --      Pulse Rate 02/26/23 1649 (!) 59     Resp 02/26/23 1649 18     Temp 02/26/23 1649 98.6 F (37 C)     Temp Source 02/26/23 1649 Oral     SpO2 02/26/23 1649 98 %     Weight --      Height --      Head Circumference --      Peak Flow --      Pain Score 02/26/23 1648 6     Pain Loc --      Pain Education --      Exclude from Growth Chart --    No data found.  Updated Vital Signs BP Marland Kitchen)  142/91 (BP Location: Right Arm)   Pulse (!) 59   Temp 98.6 F (37 C) (Oral)   Resp 18   SpO2 98%   Visual Acuity Right Eye Distance:   Left Eye Distance:   Bilateral Distance:    Right Eye Near:   Left Eye Near:    Bilateral Near:     Physical Exam Vitals and nursing note reviewed.  Constitutional:      General: He is not in acute distress.    Appearance: He is well-developed.  HENT:     Head: Normocephalic and atraumatic.  Eyes:     Conjunctiva/sclera: Conjunctivae normal.  Cardiovascular:     Rate and Rhythm: Normal rate and regular rhythm.     Heart sounds: No murmur heard. Pulmonary:     Effort: Pulmonary effort is normal. No respiratory distress.     Breath sounds: Normal breath sounds.  Abdominal:     Palpations: Abdomen is soft.     Tenderness: There is no abdominal tenderness.  Musculoskeletal:        General: No swelling.     Cervical back: Neck supple.       Legs:  Skin:    General: Skin is warm  and dry.     Capillary Refill: Capillary refill takes less than 2 seconds.  Neurological:     Mental Status: He is alert.  Psychiatric:        Mood and Affect: Mood normal.      UC Treatments / Results  Labs (all labs ordered are listed, but only abnormal results are displayed) Labs Reviewed - No data to display  EKG   Radiology No results found.  Procedures Procedures (including critical care time)  Medications Ordered in UC Medications - No data to display  Initial Impression / Assessment and Plan / UC Course  I have reviewed the triage vital signs and the nursing notes.  Pertinent labs & imaging results that were available during my care of the patient were reviewed by me and considered in my medical decision making (see chart for details).     Groin pain.  Likely musculoskeletal in nature.  No bulge noted, low suspicion for hernia.  Advise Flexeril and ibuprofen.  Patient advised to return if no improvement or he begins to notice a more prominent bulge. Final Clinical Impressions(s) / UC Diagnoses   Final diagnoses:  Right inguinal pain     Discharge Instructions      Take take Ibuprofen and flexeril as needed Recommend ice to affected area  If no improvement or you notice a bulge follow up with PCP or return here   ED Prescriptions     Medication Sig Dispense Auth. Provider   cyclobenzaprine (FLEXERIL) 10 MG tablet Take 1 tablet (10 mg total) by mouth 2 (two) times daily as needed for muscle spasms. 20 tablet Ward, Tylene Fantasia, PA-C   ibuprofen (ADVIL) 600 MG tablet Take 1 tablet (600 mg total) by mouth every 6 (six) hours as needed. 30 tablet Ward, Tylene Fantasia, PA-C      PDMP not reviewed this encounter.   Ward, Tylene Fantasia, PA-C 02/26/23 1740

## 2023-02-26 NOTE — ED Triage Notes (Signed)
Pt states he started with some right groin pain 8 days ago. He states he was riding a horse yesterday and he pulled something and not he feels swollen in the right side of the groin. He has taken tylenol

## 2023-02-26 NOTE — Discharge Instructions (Addendum)
Take take Ibuprofen and flexeril as needed Recommend ice to affected area  If no improvement or you notice a bulge follow up with PCP or return here
